# Patient Record
Sex: Male | Born: 1980 | Race: White | Hispanic: No | Marital: Married | Smoking: Current every day smoker
Health system: Southern US, Community
[De-identification: ages and names within clinical notes are randomized; demographics above are authoritative.]

## PROBLEM LIST (undated history)

## (undated) HISTORY — PX: MOUTH SURGERY: SHX715

---

## 2016-05-25 ENCOUNTER — Encounter (HOSPITAL_BASED_OUTPATIENT_CLINIC_OR_DEPARTMENT_OTHER): Payer: Self-pay | Admitting: *Deleted

## 2016-05-25 ENCOUNTER — Emergency Department (HOSPITAL_BASED_OUTPATIENT_CLINIC_OR_DEPARTMENT_OTHER)
Admission: EM | Admit: 2016-05-25 | Discharge: 2016-05-26 | Disposition: A | Discharge status: Home / Self Care | Payer: Self-pay | Attending: Emergency Medicine | Admitting: Emergency Medicine

## 2016-05-25 DIAGNOSIS — R112 Nausea with vomiting, unspecified: Secondary | ICD-10-CM | POA: Insufficient documentation

## 2016-05-25 DIAGNOSIS — R519 Headache, unspecified: Secondary | ICD-10-CM

## 2016-05-25 DIAGNOSIS — I10 Essential (primary) hypertension: Secondary | ICD-10-CM | POA: Insufficient documentation

## 2016-05-25 DIAGNOSIS — R300 Dysuria: Secondary | ICD-10-CM | POA: Insufficient documentation

## 2016-05-25 DIAGNOSIS — M549 Dorsalgia, unspecified: Secondary | ICD-10-CM | POA: Insufficient documentation

## 2016-05-25 DIAGNOSIS — R51 Headache: Secondary | ICD-10-CM | POA: Insufficient documentation

## 2016-05-25 DIAGNOSIS — F17200 Nicotine dependence, unspecified, uncomplicated: Secondary | ICD-10-CM | POA: Insufficient documentation

## 2016-05-25 LAB — URINALYSIS, ROUTINE W REFLEX MICROSCOPIC
Bilirubin Urine: NEGATIVE
GLUCOSE, UA: NEGATIVE mg/dL
Hgb urine dipstick: NEGATIVE
KETONES UR: NEGATIVE mg/dL
LEUKOCYTES UA: NEGATIVE
Nitrite: NEGATIVE
PH: 5.5 (ref 5.0–8.0)
Protein, ur: NEGATIVE mg/dL
SPECIFIC GRAVITY, URINE: 1.025 (ref 1.005–1.030)

## 2016-05-25 MED ORDER — KETOROLAC TROMETHAMINE 30 MG/ML IJ SOLN
15.0000 mg | Freq: Once | INTRAMUSCULAR | Status: AC
  Administered 2016-05-25: 15 mg via INTRAVENOUS
  Filled 2016-05-25: qty 1

## 2016-05-25 MED ORDER — METOCLOPRAMIDE HCL 5 MG/ML IJ SOLN
10.0000 mg | Freq: Once | INTRAMUSCULAR | Status: AC
  Administered 2016-05-25: 10 mg via INTRAVENOUS
  Filled 2016-05-25: qty 2

## 2016-05-25 MED ORDER — DIPHENHYDRAMINE HCL 50 MG/ML IJ SOLN
25.0000 mg | Freq: Once | INTRAMUSCULAR | Status: AC
  Administered 2016-05-25: 25 mg via INTRAVENOUS
  Filled 2016-05-25: qty 1

## 2016-05-25 NOTE — ED Provider Notes (Signed)
[facility] Provider Note   CSN: [id number] Arrival date & time: 05/25/16  2222  By signing my name below, I, [name], attest that this documentation has been prepared under the direction and in the presence of [name], MD . Electronically Signed: [name], ED Scribe. 05/25/2016. 11:11 PM.   History   Chief Complaint Chief Complaint  Patient presents with  . Headache   The history is provided by the patient. No language interpreter was used.  Headache   This is a recurrent problem. Episode onset: 2 weeks ago. Episode frequency: Every morning, persists throughout the day. The problem has not changed since onset.The headache is associated with an unknown factor. The pain is moderate. The pain does not radiate. Associated symptoms include nausea and vomiting. Pertinent negatives include no fever, no syncope and no shortness of breath. He has tried acetaminophen (Vicodin) for the symptoms. The treatment provided no relief.   HPI Comments:  Carlos Estrada is a 35 y.o. male with PMHx of HTN who presents to the Emergency Department complaining of intermittent, daily headaches for the past 2 weeks. Pt states that he typically has at least 1 headache monthly but has not had headaches for this many consecutive days before.Pt complains of associated nausea, vomiting, diaphoresis.  Pt also reports that he woke up this morning with lower back pain with associated dysuria. Pt has taken tylenol and Vicodin with no relief. Pt denies head injury, recent travel or possible animal/insect bites. Pt denies chest pain, SOB, neck pain, abdominal pain, weakness, visual changes, numbness, rash, penile discharge.    PMH - none Soc hx - no travel Home Medications    Prior to Admission medications   Not on File    Family History No family history on file.  Social History Social History  Substance Use Topics  . Smoking status: Current Every Day Smoker    Packs/day:  1.00  . Smokeless tobacco: Never Used  . Alcohol use No     Allergies   Review of patient's allergies indicates no known allergies.   Review of Systems Review of Systems  Constitutional: Negative for fever.  Respiratory: Negative for shortness of breath.   Cardiovascular: Negative for chest pain and syncope.  Gastrointestinal: Positive for nausea and vomiting. Negative for abdominal pain.  Genitourinary: Positive for difficulty urinating and dysuria. Negative for discharge.  Musculoskeletal: Positive for back pain. Negative for neck pain.  Skin: Negative for rash.  Neurological: Positive for headaches. Negative for weakness and numbness.  All other systems reviewed and are negative.    Physical Exam Updated Vital Signs BP 138/100 (BP Location: Left Arm)   Pulse 80   Temp 97.6 F (36.4 C) (Oral)   Resp 18   Ht 5\' 9"  (1.753 m)   Wt 200 lb (90.7 kg)   SpO2 99%   BMI 29.53 kg/m   Physical Exam CONSTITUTIONAL: Well developed/well nourished HEAD: Normocephalic/atraumatic EYES: EOMI/PERRL, no nystagmus, no ptosis ENMT: Mucous membranes moist NECK: supple no meningeal signs, no bruits SPINE/BACK:entire spine nontender CV: S1/S2 noted, no murmurs/rubs/gallops noted LUNGS: Lungs are clear to auscultation bilaterally, no apparent distress ABDOMEN: soft, nontender, no rebound or guarding GU:no cva tenderness NEURO:Awake/alert, face symmetric, no arm or leg drift is noted Equal 5/5 strength with shoulder abduction, elbow flex/extension, wrist flex/extension in upper extremities and equal hand grips bilaterally Equal 5/5 strength with hip flexion,knee flex/extension, foot dorsi/plantar flexion Cranial nerves 3/4/5/6/02/22/09/11/12 tested and intact Gait normal without ataxia No past  pointing Sensation to light touch intact in all extremities EXTREMITIES: pulses normal, full ROM SKIN: warm, color normal PSYCH: no abnormalities of mood noted, alert and oriented to  situation    ED Treatments / Results  DIAGNOSTIC STUDIES:  Oxygen Saturation is 99% on RA, normal by my interpretation.    COORDINATION OF CARE:  11:11 PM Discussed treatment plan with pt at bedside and pt agreed to plan.   Labs (all labs ordered are listed, but only abnormal results are displayed) Labs Reviewed  BASIC METABOLIC PANEL - Abnormal; Notable for the following:       Result Value   Glucose, Bld 110 (*)    All other components within normal limits  URINALYSIS, ROUTINE W REFLEX MICROSCOPIC (NOT [facility])  GC/CHLAMYDIA PROBE AMP ([place]) NOT AT [facility]    EKG  EKG Interpretation None       Radiology No results found.  Procedures Procedures (including critical care time)  Medications Ordered in ED Medications  metoCLOPramide (REGLAN) injection 10 mg (10 mg Intravenous Given 05/25/16 2346)  diphenhydrAMINE (BENADRYL) injection 25 mg (25 mg Intravenous Given 05/25/16 2346)  ketorolac (TORADOL) 30 MG/ML injection 15 mg (15 mg Intravenous Given 05/25/16 2345)     Initial Impression / Assessment and Plan / ED Course  I have reviewed the triage vital signs and the nursing notes.  Pertinent labs results that were available during my care of the patient were reviewed by me and considered in my medical decision making (see chart for details).  Clinical Course    Pt improved No distress noted No neuro deficits He reports monthly headaches, but this worse than normal He reports he is frequently found to have HTN but no meds started He has no PCP at this time Will start HCTZ low dose and given referral to PCP At this point there are no clinical signs of acute neurologic emergency  BP 130/82   Pulse 72   Temp 97.6 F (36.4 C) (Oral)   Resp 18   Ht 5\' 9"  (1.753 m)   Wt 90.7 kg   SpO2 97%   BMI 29.53 kg/m   Final Clinical Impressions(s) / ED Diagnoses   Final diagnoses:  Acute nonintractable headache, unspecified headache type  Hypertension,  unspecified type    New Prescriptions New Prescriptions   HYDROCHLOROTHIAZIDE (HYDRODIURIL) 12.5 MG TABLET    Take 1 tablet (12.5 mg total) by mouth daily.  I personally performed the services described in this documentation, which was scribed in my presence. The recorded information has been reviewed and is accurate.        Zadie Rhineonald Darshan Solanki, MD 05/26/16 [phone]

## 2016-05-25 NOTE — ED Triage Notes (Addendum)
Headache x 2 weeks. Nausea, light sensitive. States he has burning with urination.

## 2016-05-25 NOTE — ED Notes (Signed)
Head pain off and on for the last two weeks that is unrelieved by tylenol and vicodin with photosensitivity and nausea.

## 2016-05-26 LAB — BASIC METABOLIC PANEL
Anion gap: 6 (ref 5–15)
BUN: 20 mg/dL (ref 6–20)
CHLORIDE: 106 mmol/L (ref 101–111)
CO2: 26 mmol/L (ref 22–32)
Calcium: 9.1 mg/dL (ref 8.9–10.3)
Creatinine, Ser: 1.02 mg/dL (ref 0.61–1.24)
GFR calc non Af Amer: >60 mL/min (ref >60)
Glucose, Bld: 110 mg/dL — ABNORMAL HIGH (ref 65–99)
Potassium: 4.1 mmol/L (ref 3.5–5.1)
SODIUM: 138 mmol/L (ref 135–145)

## 2016-05-26 MED ORDER — HYDROCHLOROTHIAZIDE 12.5 MG PO TABS: 12.5000 mg | ORAL_TABLET | Freq: Every day | ORAL | 0 refills | Status: DC

## 2016-05-26 NOTE — ED Notes (Signed)
Pt verbalizes understanding of d/c instructions and denies any further needs at this time. 

## 2016-05-26 NOTE — Discharge Instructions (Signed)

## 2016-05-27 LAB — GC/CHLAMYDIA PROBE AMP (~~LOC~~) NOT AT ARMC
Chlamydia: NEGATIVE
Neisseria Gonorrhea: NEGATIVE

## 2016-11-18 ENCOUNTER — Emergency Department (HOSPITAL_BASED_OUTPATIENT_CLINIC_OR_DEPARTMENT_OTHER)
Admission: EM | Admit: 2016-11-18 | Discharge: 2016-11-18 | Disposition: A | Discharge status: Home / Self Care | Payer: Self-pay | Attending: Emergency Medicine | Admitting: Emergency Medicine

## 2016-11-18 ENCOUNTER — Encounter (HOSPITAL_BASED_OUTPATIENT_CLINIC_OR_DEPARTMENT_OTHER): Payer: Self-pay | Admitting: *Deleted

## 2016-11-18 ENCOUNTER — Emergency Department (HOSPITAL_BASED_OUTPATIENT_CLINIC_OR_DEPARTMENT_OTHER): Payer: Self-pay

## 2016-11-18 DIAGNOSIS — F17200 Nicotine dependence, unspecified, uncomplicated: Secondary | ICD-10-CM | POA: Insufficient documentation

## 2016-11-18 DIAGNOSIS — M5442 Lumbago with sciatica, left side: Secondary | ICD-10-CM | POA: Insufficient documentation

## 2016-11-18 DIAGNOSIS — M5432 Sciatica, left side: Secondary | ICD-10-CM

## 2016-11-18 MED ORDER — HYDROCODONE-ACETAMINOPHEN 5-325 MG PO TABS
1.0000 | ORAL_TABLET | Freq: Once | ORAL | Status: AC
  Administered 2016-11-18: 1 via ORAL
  Filled 2016-11-18: qty 1

## 2016-11-18 MED ORDER — KETOROLAC TROMETHAMINE 60 MG/2ML IM SOLN
60.0000 mg | Freq: Once | INTRAMUSCULAR | Status: AC
  Administered 2016-11-18: 60 mg via INTRAMUSCULAR
  Filled 2016-11-18: qty 2

## 2016-11-18 MED ORDER — HYDROCODONE-ACETAMINOPHEN 5-325 MG PO TABS: 1.0000 | ORAL_TABLET | Freq: Four times a day (QID) | ORAL | 0 refills | Status: DC | PRN

## 2016-11-18 MED ORDER — CYCLOBENZAPRINE HCL 10 MG PO TABS: 10.0000 mg | ORAL_TABLET | Freq: Every day | ORAL | 0 refills | Status: DC

## 2016-11-18 MED ORDER — PREDNISONE 50 MG PO TABS: 50.0000 mg | ORAL_TABLET | Freq: Every day | ORAL | 0 refills | Status: DC

## 2016-11-18 MED FILL — CYCLOBENZAPRINE 10 MG TAB: 10 | 10 days supply | Qty: 10 | Fill #0

## 2016-11-18 MED FILL — HYDROCODON-APAP 5-325: 5-325 | 4 days supply | Qty: 15 | Fill #0

## 2016-11-18 MED FILL — predniSONE 50 MG TABS: 50 | 5 days supply | Qty: 5 | Fill #0

## 2016-11-18 NOTE — ED Notes (Signed)
Pt given Rx x 3 for flexeril, hydrocodone, and prednisone. Pt has a ride at bedside

## 2016-11-18 NOTE — ED Provider Notes (Signed)
[facility]   CSN: [id number] Arrival date & time: 11/18/16  1612  By signing my name below, I, Arianna Nassar, attest that this documentation has been prepared under the direction and in the presence of [employer], PA-C.  Electronically Signed: Octavia Heir, ED Scribe. 11/18/16. 4:40 PM.    History   Chief Complaint Chief Complaint  Patient presents with  . Back Pain   The history is provided by the patient. No language interpreter was used.   HPI Comments: Carlos Estrada is a 36 y.o. male who presents to the Emergency Department complaining of acute onset, recurrent, low back pain that radiates into his left hip x several months. He notes associated pain when coughing. Pt reports difficulty sitting up and ambulating secondary to pain. He has been evaluated for this in the past but has no intervention. He denies any heavy lifting or injury to his back. Pt does not have any bowel or bladder incontinence and weakness of his lower extremities.   History reviewed. No pertinent past medical history.  There are no active problems to display for this patient.   History reviewed. No pertinent surgical history.     Home Medications    Prior to Admission medications   Not on File    Family History History reviewed. No pertinent family history.  Social History Social History  Substance Use Topics  . Smoking status: Current Every Day Smoker    Packs/day: 1.00  . Smokeless tobacco: Never Used  . Alcohol use No     Allergies   Zithromax [azithromycin]   Review of Systems Review of Systems  A complete 10 system review of systems was obtained and all systems are negative except as noted in the HPI and PMH.   Physical Exam Updated Vital Signs BP (!) 137/93 (BP Location: Left Arm)   Pulse 100   Temp 98.1 F (36.7 C) (Oral)   Resp 18   Ht  (1.753 m)   Wt 210 lb (95.3 kg)   SpO2 99%   BMI 31.01 kg/m   Physical Exam    Constitutional: He is oriented to person, place, and time. He appears well-developed and well-nourished.  HENT:  Head: Normocephalic.  Eyes: EOM are normal.  Neck: Normal range of motion.  Pulmonary/Chest: Effort normal.  Abdominal: He exhibits no distension.  Musculoskeletal: Normal range of motion. He exhibits no tenderness.  Subjective spasm in left lower lateral back  Neurological: He is alert and oriented to person, place, and time.  Psychiatric: He has a normal mood and affect.  Nursing note and vitals reviewed.    ED Treatments / Results  DIAGNOSTIC STUDIES: Oxygen Saturation is 99% on RA, normal by my interpretation.  COORDINATION OF CARE:  4:39 PM Discussed treatment plan with pt at bedside and pt agreed to plan.  Labs (all labs ordered are listed, but only abnormal results are displayed) Labs Reviewed - No data to display  EKG  EKG Interpretation None       Radiology No results found.  Procedures Procedures (including critical care time)  Medications Ordered in ED Medications - No data to display   Initial Impression / Assessment and Plan / ED Course  I have reviewed the triage vital signs and the nursing notes.  Pertinent labs & imaging results that were available during my care of the patient were reviewed by me and considered in my medical decision making (see chart for details).     The patient  will be referred to neurosurgery for further eval. Told to return here as needed. Ice and heat to his back. This is sciatica that could have a component associated with a disc issue.  Final Clinical Impressions(s) / ED Diagnoses   Final diagnoses:  None  I personally performed the services described in this documentation, which was scribed in my presence. The recorded information has been reviewed and is accurate.  New Prescriptions New Prescriptions   No medications on file     Charlestine Night, PA-C 11/18/16 1718    Loren Racer,  MD 11/18/16 2325

## 2016-11-18 NOTE — ED Notes (Addendum)
Pain radiates into left buttocks with certain movements.

## 2016-11-18 NOTE — Discharge Instructions (Signed)
Return here as needed. Follow up with the doctor provided. Your x-rays were negative. You will need further evaluation however.

## 2016-11-18 NOTE — ED Triage Notes (Signed)
Pt reports back and left hip pain x several months.

## 2017-03-04 ENCOUNTER — Encounter (HOSPITAL_BASED_OUTPATIENT_CLINIC_OR_DEPARTMENT_OTHER): Payer: Self-pay

## 2017-03-04 ENCOUNTER — Emergency Department (HOSPITAL_BASED_OUTPATIENT_CLINIC_OR_DEPARTMENT_OTHER)
Admission: EM | Admit: 2017-03-04 | Discharge: 2017-03-05 | Disposition: A | Discharge status: Home / Self Care | Payer: Self-pay | Attending: Emergency Medicine | Admitting: Emergency Medicine

## 2017-03-04 DIAGNOSIS — F17200 Nicotine dependence, unspecified, uncomplicated: Secondary | ICD-10-CM | POA: Insufficient documentation

## 2017-03-04 DIAGNOSIS — R740 Nonspecific elevation of levels of transaminase and lactic acid dehydrogenase [LDH]: Secondary | ICD-10-CM | POA: Insufficient documentation

## 2017-03-04 DIAGNOSIS — R1013 Epigastric pain: Secondary | ICD-10-CM | POA: Insufficient documentation

## 2017-03-04 DIAGNOSIS — R7401 Elevation of levels of liver transaminase levels: Secondary | ICD-10-CM

## 2017-03-04 NOTE — ED Triage Notes (Signed)
Pt c/o midsternal chest pain for the last two days that comes and goes without any associated symptoms and he currently denies pain.  Pt had an episode last night while trying to have a bowel movement with the same pain, but he also had SOB, diaphoresis and dizziness.  No acute distress at this time.

## 2017-03-05 LAB — CBC WITH DIFFERENTIAL/PLATELET
BASOS ABS: 0 10*3/uL (ref 0.0–0.1)
BASOS PCT: 0 %
EOS ABS: 0.2 10*3/uL (ref 0.0–0.7)
Eosinophils Relative: 2 %
HCT: 42.7 % (ref 39.0–52.0)
Hemoglobin: 14.4 g/dL (ref 13.0–17.0)
Lymphocytes Relative: 32 %
Lymphs Abs: 3 10*3/uL (ref 0.7–4.0)
MCH: 28.6 pg (ref 26.0–34.0)
MCHC: 33.7 g/dL (ref 30.0–36.0)
MCV: 84.9 fL (ref 78.0–100.0)
MONO ABS: 0.9 10*3/uL (ref 0.1–1.0)
MONOS PCT: 10 %
Neutro Abs: 5.1 10*3/uL (ref 1.7–7.7)
Neutrophils Relative %: 56 %
PLATELETS: 295 10*3/uL (ref 150–400)
RBC: 5.03 MIL/uL (ref 4.22–5.81)
RDW: 12.8 % (ref 11.5–15.5)
WBC: 9.1 10*3/uL (ref 4.0–10.5)

## 2017-03-05 LAB — COMPREHENSIVE METABOLIC PANEL
ALBUMIN: 3.7 g/dL (ref 3.5–5.0)
ALT: 165 U/L — AB (ref 17–63)
AST: 156 U/L — ABNORMAL HIGH (ref 15–41)
Alkaline Phosphatase: 117 U/L (ref 38–126)
Anion gap: 8 (ref 5–15)
BILIRUBIN TOTAL: 0.2 mg/dL — AB (ref 0.3–1.2)
BUN: 18 mg/dL (ref 6–20)
CO2: 28 mmol/L (ref 22–32)
CREATININE: 1.06 mg/dL (ref 0.61–1.24)
Calcium: 9 mg/dL (ref 8.9–10.3)
Chloride: 102 mmol/L (ref 101–111)
GFR calc non Af Amer: >60 mL/min (ref >60)
GLUCOSE: 99 mg/dL (ref 65–99)
Potassium: 4.3 mmol/L (ref 3.5–5.1)
SODIUM: 138 mmol/L (ref 135–145)
TOTAL PROTEIN: 6.8 g/dL (ref 6.5–8.1)

## 2017-03-05 LAB — LIPASE, BLOOD: LIPASE: 29 U/L (ref 11–51)

## 2017-03-05 LAB — TROPONIN I

## 2017-03-05 MED ORDER — OMEPRAZOLE 20 MG PO CPDR: 20.0000 mg | DELAYED_RELEASE_CAPSULE | Freq: Every day | ORAL | 0 refills | Status: DC

## 2017-03-05 MED ORDER — PANTOPRAZOLE SODIUM 40 MG IV SOLR
40.0000 mg | Freq: Once | INTRAVENOUS | Status: AC
  Administered 2017-03-05: 40 mg via INTRAVENOUS
  Filled 2017-03-05: qty 40

## 2017-03-05 NOTE — ED Provider Notes (Signed)
[facility] Provider Note: Carlos Dell, MD, FACEP  CSN: [id number] MRN: [id number] ARRIVAL: 03/04/17 at 2320 ROOM: [facility]01/[facility]01   CHIEF COMPLAINT  Epigastric Pain   HISTORY OF PRESENT ILLNESS  Carlos Estrada is a 36 y.o. male with a two-day history of intermittent epigastric pain. 2 days ago the pain began as burning pain which she describes as a 4 out of 10. At that time there were no significant associated symptoms. About 24 hours ago he had a much worse episode which she describes as a sharp pain which was a 10 out of 10. As he was trying to have a bowel movement he became nauseated, diaphoretic and felt like he was going to pass out. He did not pass out and the feeling that he would pass out resolved after a few minutes. The pain lasted about 30 minutes total. He did not take anything for the pain except water which did not make a significant difference. There was no associated shortness of breath. There was no associated diarrhea. He has had several subsequent episodes of the milder, burning pain without other symptoms. Pain is not worse with movement or palpation of his epigastrium.   History reviewed. No pertinent past medical history.  Past Surgical History:  Procedure Laterality Date  . MOUTH SURGERY      No family history on file.  Social History  Substance Use Topics  . Smoking status: Current Every Day Smoker    Packs/day: 1.00  . Smokeless tobacco: Never Used  . Alcohol use No    Prior to Admission medications   Not on File    Allergies Zithromax [azithromycin]   REVIEW OF SYSTEMS  Negative except as noted here or in the History of Present Illness.   PHYSICAL EXAMINATION  Initial Vital Signs Blood pressure (!) 156/101, pulse 65, temperature 98 F (36.7 C), temperature source Oral, resp. rate 18, height 5\' 9"  (1.753 m), weight 93 kg (205 lb), SpO2 100 %.  Examination General: Well-developed, well-nourished male in no acute distress;  appearance consistent with age of record HENT: normocephalic; atraumatic Eyes: pupils equal, round and reactive to light; extraocular muscles intact Neck: supple Heart: regular rate and rhythm Lungs: clear to auscultation bilaterally Abdomen: soft; nondistended; nontender; no masses or hepatosplenomegaly; bowel sounds present Extremities: No deformity; full range of motion; pulses normal Neurologic: Awake, alert and oriented; motor function intact in all extremities and symmetric; no facial droop Skin: Warm and dry Psychiatric: Normal mood and affect   RESULTS  Summary of this visit's results, reviewed by myself:   EKG Interpretation  Date/Time:  Thursday March 04 2017 23:27:20 EDT Ventricular Rate:  65 PR Interval:    QRS Duration: 76 QT Interval:  369 QTC Calculation: 384 R Axis:   75 Text Interpretation:  Sinus rhythm Borderline T wave abnormalities No previous ECGs available Confirmed by Carlos Estrada ([id number]) on 03/04/2017 11:33:08 PM      Laboratory Studies: Results for orders placed or performed during the hospital encounter of 03/04/17 (from the past 24 hour(s))  Comprehensive metabolic panel     Status: Abnormal   Collection Time: 03/05/17 12:20 AM  Result Value Ref Range   Sodium 138 135 - 145 mmol/L   Potassium 4.3 3.5 - 5.1 mmol/L   Chloride 102 101 - 111 mmol/L   CO2 28 22 - 32 mmol/L   Glucose, Bld 99 65 - 99 mg/dL   BUN 18 6 - 20 mg/dL   Creatinine, Ser 3.29 0.61 -  1.24 mg/dL   Calcium 9.0 8.9 - 16.110.3 mg/dL   Total Protein 6.8 6.5 - 8.1 g/dL   Albumin 3.7 3.5 - 5.0 g/dL   AST 096156 (H) 15 - 41 U/L   ALT 165 (H) 17 - 63 U/L   Alkaline Phosphatase 117 38 - 126 U/L   Total Bilirubin 0.2 (L) 0.3 - 1.2 mg/dL   GFR calc non Af Amer >60 >60 mL/min   GFR calc Af Amer >60 >60 mL/min   Anion gap 8 5 - 15  CBC with Differential/Platelet     Status: None   Collection Time: 03/05/17 12:20 AM  Result Value Ref Range   WBC 9.1 4.0 - 10.5 K/uL   RBC 5.03 4.22 - 5.81  MIL/uL   Hemoglobin 14.4 13.0 - 17.0 g/dL   HCT 04.542.7 40.939.0 - 81.152.0 %   MCV 84.9 78.0 - 100.0 fL   MCH 28.6 26.0 - 34.0 pg   MCHC 33.7 30.0 - 36.0 g/dL   RDW 91.412.8 78.211.5 - 95.615.5 %   Platelets 295 150 - 400 K/uL   Neutrophils Relative % 56 %   Neutro Abs 5.1 1.7 - 7.7 K/uL   Lymphocytes Relative 32 %   Lymphs Abs 3.0 0.7 - 4.0 K/uL   Monocytes Relative 10 %   Monocytes Absolute 0.9 0.1 - 1.0 K/uL   Eosinophils Relative 2 %   Eosinophils Absolute 0.2 0.0 - 0.7 K/uL   Basophils Relative 0 %   Basophils Absolute 0.0 0.0 - 0.1 K/uL  Lipase, blood     Status: None   Collection Time: 03/05/17 12:20 AM  Result Value Ref Range   Lipase 29 11 - 51 U/L  Troponin I     Status: None   Collection Time: 03/05/17 12:20 AM  Result Value Ref Range   Troponin I <0.03 <0.03 ng/mL   Imaging Studies: No results found.  ED COURSE  Nursing notes and initial vitals signs, including pulse oximetry, reviewed.  Vitals:   03/04/17 2328 03/04/17 2332 03/05/17 0000 03/05/17 0100  BP:  (!) 156/101 140/88 (!) 137/91  Pulse:  65 65 65  Resp:  18 13 14   Temp:  98 F (36.7 C)    TempSrc:  Oral    SpO2:  100% 99% 97%  Weight: 93 kg (205 lb)     Height: 5\' 9"  (1.753 m)      1:12 AM Patient advised of mildly elevated transaminases but without elevated bilirubin or alkaline phosphatase. He denies Tylenol use, alcohol use or IV drug use. We will start him on a PPI for his epigastric pain and refer to gastroenterology for evaluation of his pain and his elevated transaminases.  PROCEDURES    ED DIAGNOSES     ICD-10-CM   1. Epigastric pain R10.13   2. Elevated transaminase level R74.0        Carlos Estrada, Carlos RuizJohn, MD 03/05/17 [id number]

## 2017-03-05 NOTE — ED Notes (Signed)
Pt verbalizes understanding of d/c instructions and denies any further needs at this time. 

## 2017-04-18 ENCOUNTER — Emergency Department (HOSPITAL_BASED_OUTPATIENT_CLINIC_OR_DEPARTMENT_OTHER)
Admission: EM | Admit: 2017-04-18 | Discharge: 2017-04-18 | Disposition: A | Discharge status: Home / Self Care | Payer: Self-pay | Attending: Emergency Medicine | Admitting: Emergency Medicine

## 2017-04-18 ENCOUNTER — Emergency Department (HOSPITAL_BASED_OUTPATIENT_CLINIC_OR_DEPARTMENT_OTHER): Payer: Self-pay

## 2017-04-18 ENCOUNTER — Encounter (HOSPITAL_BASED_OUTPATIENT_CLINIC_OR_DEPARTMENT_OTHER): Payer: Self-pay | Admitting: Emergency Medicine

## 2017-04-18 DIAGNOSIS — Z79899 Other long term (current) drug therapy: Secondary | ICD-10-CM | POA: Insufficient documentation

## 2017-04-18 DIAGNOSIS — R1013 Epigastric pain: Secondary | ICD-10-CM

## 2017-04-18 DIAGNOSIS — Y929 Unspecified place or not applicable: Secondary | ICD-10-CM | POA: Insufficient documentation

## 2017-04-18 DIAGNOSIS — Y999 Unspecified external cause status: Secondary | ICD-10-CM | POA: Insufficient documentation

## 2017-04-18 DIAGNOSIS — S20211A Contusion of right front wall of thorax, initial encounter: Secondary | ICD-10-CM | POA: Insufficient documentation

## 2017-04-18 DIAGNOSIS — Y939 Activity, unspecified: Secondary | ICD-10-CM | POA: Insufficient documentation

## 2017-04-18 DIAGNOSIS — W19XXXA Unspecified fall, initial encounter: Secondary | ICD-10-CM | POA: Insufficient documentation

## 2017-04-18 DIAGNOSIS — F17210 Nicotine dependence, cigarettes, uncomplicated: Secondary | ICD-10-CM | POA: Insufficient documentation

## 2017-04-18 LAB — COMPREHENSIVE METABOLIC PANEL
ALBUMIN: 3.9 g/dL (ref 3.5–5.0)
ALK PHOS: 79 U/L (ref 38–126)
ALT: 64 U/L — ABNORMAL HIGH (ref 17–63)
AST: 141 U/L — ABNORMAL HIGH (ref 15–41)
Anion gap: 8 (ref 5–15)
BUN: 17 mg/dL (ref 6–20)
CHLORIDE: 105 mmol/L (ref 101–111)
CO2: 24 mmol/L (ref 22–32)
CREATININE: 0.96 mg/dL (ref 0.61–1.24)
Calcium: 8.8 mg/dL — ABNORMAL LOW (ref 8.9–10.3)
GFR calc non Af Amer: >60 mL/min (ref >60)
Glucose, Bld: 132 mg/dL — ABNORMAL HIGH (ref 65–99)
Potassium: 3.5 mmol/L (ref 3.5–5.1)
SODIUM: 137 mmol/L (ref 135–145)
Total Bilirubin: 0.4 mg/dL (ref 0.3–1.2)
Total Protein: 6.7 g/dL (ref 6.5–8.1)

## 2017-04-18 LAB — CBC WITH DIFFERENTIAL/PLATELET
Basophils Absolute: 0 10*3/uL (ref 0.0–0.1)
Basophils Relative: 0 %
EOS ABS: 0.2 10*3/uL (ref 0.0–0.7)
Eosinophils Relative: 1 %
HEMATOCRIT: 43.1 % (ref 39.0–52.0)
HEMOGLOBIN: 14.4 g/dL (ref 13.0–17.0)
LYMPHS ABS: 2 10*3/uL (ref 0.7–4.0)
Lymphocytes Relative: 12 %
MCH: 28.4 pg (ref 26.0–34.0)
MCHC: 33.4 g/dL (ref 30.0–36.0)
MCV: 85 fL (ref 78.0–100.0)
MONOS PCT: 6 %
Monocytes Absolute: 1 10*3/uL (ref 0.1–1.0)
NEUTROS PCT: 81 %
Neutro Abs: 13 10*3/uL — ABNORMAL HIGH (ref 1.7–7.7)
Platelets: 246 10*3/uL (ref 150–400)
RBC: 5.07 MIL/uL (ref 4.22–5.81)
RDW: 12.4 % (ref 11.5–15.5)
WBC: 16.2 10*3/uL — AB (ref 4.0–10.5)

## 2017-04-18 LAB — LIPASE, BLOOD: LIPASE: 29 U/L (ref 11–51)

## 2017-04-18 MED ORDER — SUCRALFATE 1 GM/10ML PO SUSP
1.0000 g | Freq: Three times a day (TID) | ORAL | Status: DC
  Administered 2017-04-18: 1 g via ORAL
  Filled 2017-04-18: qty 10

## 2017-04-18 MED ORDER — OMEPRAZOLE 20 MG PO CPDR: 20.0000 mg | DELAYED_RELEASE_CAPSULE | Freq: Every day | ORAL | 0 refills | Status: AC

## 2017-04-18 MED ORDER — PANTOPRAZOLE SODIUM 40 MG IV SOLR
40.0000 mg | Freq: Once | INTRAVENOUS | Status: AC
  Administered 2017-04-18: 40 mg via INTRAVENOUS
  Filled 2017-04-18: qty 40

## 2017-04-18 NOTE — ED Provider Notes (Signed)
[facility] Provider Note: Carlos Dell, MD, FACEP  CSN: [id number] MRN: [id number] ARRIVAL: 04/18/17 at 0017 ROOM: [facility]10/[facility]10   CHIEF COMPLAINT  Abdominal Pain (epigastric)   HISTORY OF PRESENT ILLNESS  04/18/17 12:38 AM Carlos Estrada is a 36 y.o. male who was seen by myself in July for epigastric pain. At the time he had mildly elevated transaminases. He was given a prescription for omeprazole which do not fill and he did not follow-up. He is here with a new episode of epigastric pain that began about 2 hours ago while eating. The onset was sudden. He describes the pain as severe and feeling like knives in his stomach. He became nauseated and clammy but he did not vomit. He had a bowel movement but significantly improved his pain. He is still having pain but it is much milder. Pain is somewhat worse with movement or palpation.  He states he had a wood floor break away from him yesterday morning about 11 AM and he struck the right side of his chest. He is having moderate pain in his right chest wall, worse with movement or palpation. He is not having shortness of breath.   History reviewed. No pertinent past medical history.  Past Surgical History:  Procedure Laterality Date  . MOUTH SURGERY      History reviewed. No pertinent family history.  Social History  Substance Use Topics  . Smoking status: Current Every Day Smoker    Packs/day: 2.00    Types: Cigarettes  . Smokeless tobacco: Never Used  . Alcohol use No     Comment: occ    Prior to Admission medications   Medication Sig Start Date End Date Taking? Authorizing Provider  omeprazole (PRILOSEC) 20 MG capsule Take 1 capsule (20 mg total) by mouth daily. 03/05/17   Haik Mahoney, MD    Allergies Zithromax [azithromycin]   REVIEW OF SYSTEMS  Negative except as noted here or in the History of Present Illness.   PHYSICAL EXAMINATION  Initial Vital Signs Blood pressure (!) 163/103, pulse 77, temperature  98.2 F (36.8 C), temperature source Oral, resp. rate 16, height 5\' 9"  (1.753 m), weight 97.5 kg (215 lb), SpO2 99 %.  Examination General: Well-developed, well-nourished male in no acute distress; appearance consistent with age of record HENT: normocephalic; atraumatic Eyes: pupils equal, round and reactive to light; extraocular muscles intact Neck: supple Heart: regular rate and rhythm Lungs: clear to auscultation bilaterally Chest: Right lateral rib tenderness without deformity or crepitus Abdomen: soft; nondistended; mild epigastric tenderness; no masses or hepatosplenomegaly; bowel sounds present Extremities: No deformity; full range of motion Neurologic: Awake, alert and oriented; motor function intact in all extremities and symmetric; no facial droop Skin: Warm and dry Psychiatric: Normal mood and affect   RESULTS  Summary of this visit's results, reviewed by myself:   EKG Interpretation  Date/Time:    Ventricular Rate:    PR Interval:    QRS Duration:   QT Interval:    QTC Calculation:   R Axis:     Text Interpretation:        Laboratory Studies: Results for orders placed or performed during the hospital encounter of 04/18/17 (from the past 24 hour(s))  CBC with Differential/Platelet     Status: Abnormal   Collection Time: 04/18/17 12:52 AM  Result Value Ref Range   WBC 16.2 (H) 4.0 - 10.5 K/uL   RBC 5.07 4.22 - 5.81 MIL/uL   Hemoglobin 14.4 13.0 - 17.0 g/dL  HCT 43.1 39.0 - 52.0 %   MCV 85.0 78.0 - 100.0 fL   MCH 28.4 26.0 - 34.0 pg   MCHC 33.4 30.0 - 36.0 g/dL   RDW 16.112.4 09.611.5 - 04.515.5 %   Platelets 246 150 - 400 K/uL   Neutrophils Relative % 81 %   Neutro Abs 13.0 (H) 1.7 - 7.7 K/uL   Lymphocytes Relative 12 %   Lymphs Abs 2.0 0.7 - 4.0 K/uL   Monocytes Relative 6 %   Monocytes Absolute 1.0 0.1 - 1.0 K/uL   Eosinophils Relative 1 %   Eosinophils Absolute 0.2 0.0 - 0.7 K/uL   Basophils Relative 0 %   Basophils Absolute 0.0 0.0 - 0.1 K/uL  Lipase,  blood     Status: None   Collection Time: 04/18/17 12:52 AM  Result Value Ref Range   Lipase 29 11 - 51 U/L  Comprehensive metabolic panel     Status: Abnormal   Collection Time: 04/18/17 12:52 AM  Result Value Ref Range   Sodium 137 135 - 145 mmol/L   Potassium 3.5 3.5 - 5.1 mmol/L   Chloride 105 101 - 111 mmol/L   CO2 24 22 - 32 mmol/L   Glucose, Bld 132 (H) 65 - 99 mg/dL   BUN 17 6 - 20 mg/dL   Creatinine, Ser 4.090.96 0.61 - 1.24 mg/dL   Calcium 8.8 (L) 8.9 - 10.3 mg/dL   Total Protein 6.7 6.5 - 8.1 g/dL   Albumin 3.9 3.5 - 5.0 g/dL   AST 811141 (H) 15 - 41 U/L   ALT 64 (H) 17 - 63 U/L   Alkaline Phosphatase 79 38 - 126 U/L   Total Bilirubin 0.4 0.3 - 1.2 mg/dL   GFR calc non Af Amer >60 >60 mL/min   GFR calc Af Amer >60 >60 mL/min   Anion gap 8 5 - 15   Imaging Studies: Dg Ribs Unilateral W/chest Right  Result Date: 04/18/2017 CLINICAL DATA:  Right rib pain today after falling while pressure washing EXAM: RIGHT RIBS AND CHEST - 3+ VIEW COMPARISON:  None. FINDINGS: Right rib detail images are negative for displaced fracture or significant bone abnormality. The lungs are clear. Hilar and mediastinal contours are normal. Heart size is normal. No pleural effusion. Normal pulmonary vasculature. IMPRESSION: No significant abnormality Electronically Signed   By: Ellery Plunkaniel R Mitchell M.D.   On: 04/18/2017 01:20    ED COURSE  Nursing notes and initial vitals signs, including pulse oximetry, reviewed.  Vitals:   04/18/17 0022 04/18/17 0023  BP: (!) 163/103   Pulse: 77   Resp: 16   Temp: 98.2 F (36.8 C)   TempSrc: Oral   SpO2: 99%   Weight:  97.5 kg (215 lb)  Height:  5\' 9"  (1.753 m)   1:36 AM Patient's transaminases improved from previous visit. Rib films are negative for acute fracture. We will place him on a PPI.  PROCEDURES    ED DIAGNOSES     ICD-10-CM   1. Epigastric pain R10.13   2. Rib contusion, right, initial encounter B14.782NS20.211A        Paula LibraMolpus, Chasity Outten, MD 04/18/17  [phone]

## 2017-04-18 NOTE — ED Triage Notes (Signed)
Patient with epigastric pain, started tonight, denies N/V. Was evaluated by EMS at home and declined transport. Seen for same July 2018, given Rx for Prilosec, did not fill, did not follow up.

## 2019-02-19 IMAGING — CR DG RIBS W/ CHEST 3+V*R*
3 series · 3 of 3 positions shown · non-contrast
Comparison: None.

CLINICAL DATA: Right rib pain today after falling while pressure
washing

EXAM:
RIGHT RIBS AND CHEST - 3+ VIEW

[w chest pa]
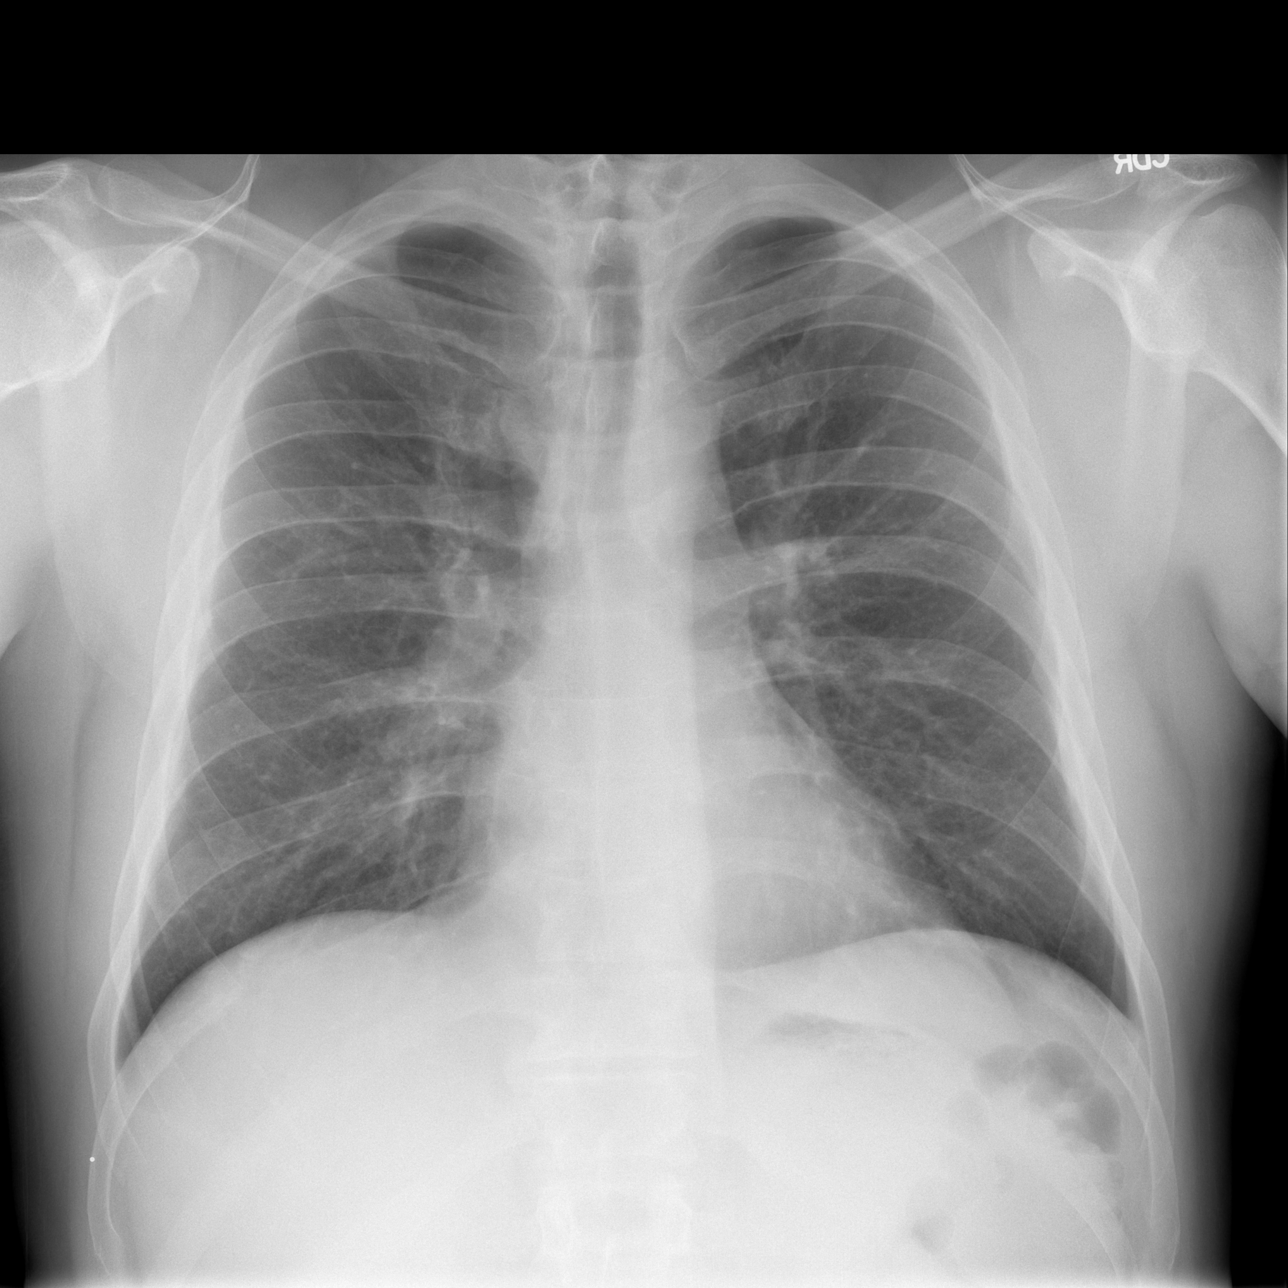

[w ribs ap/pa upper right]
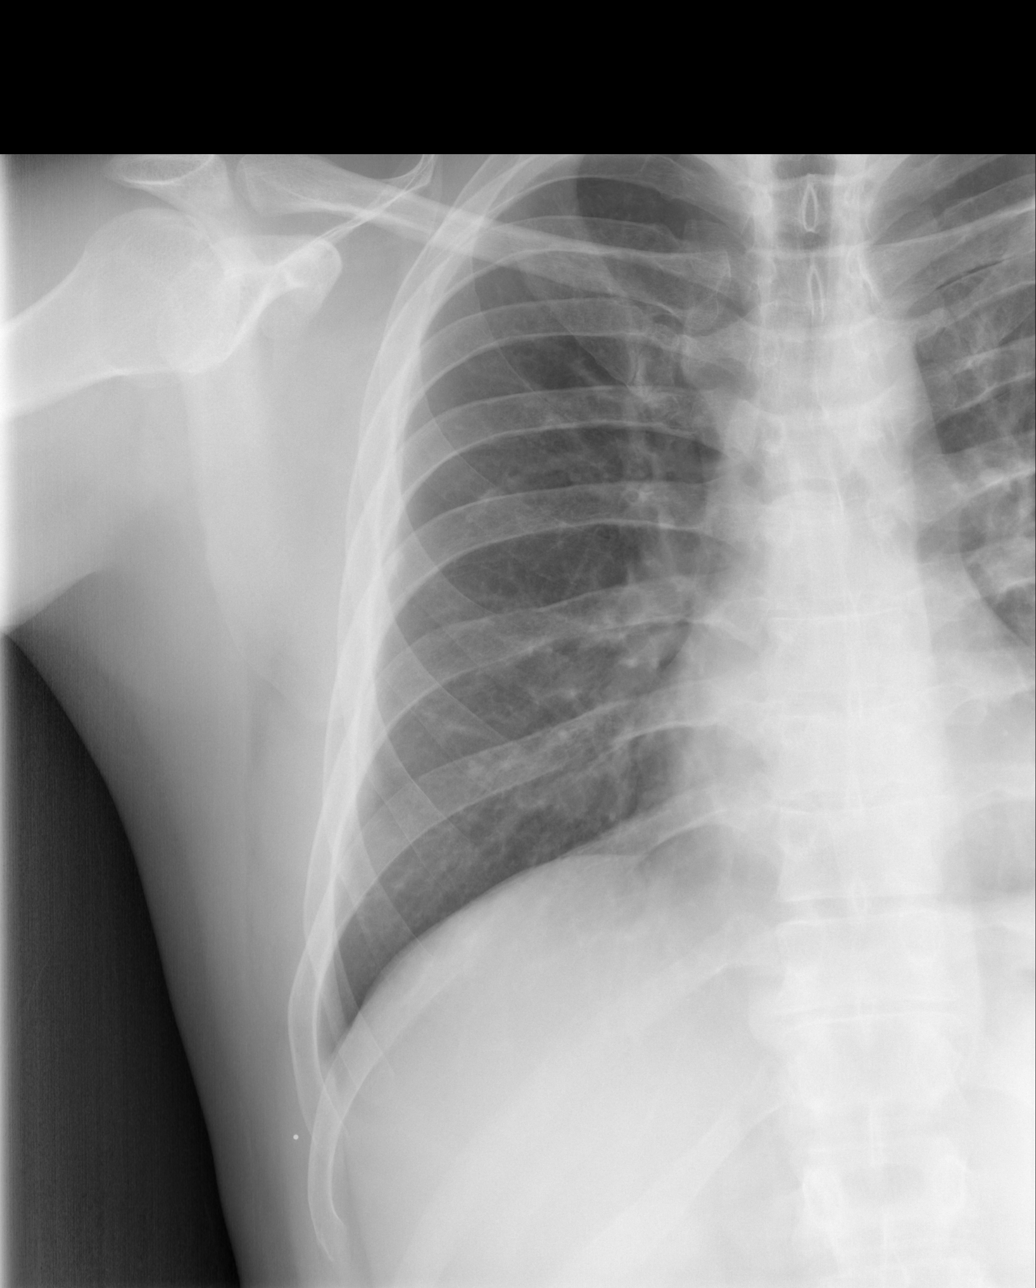

[w ribs oblique right]
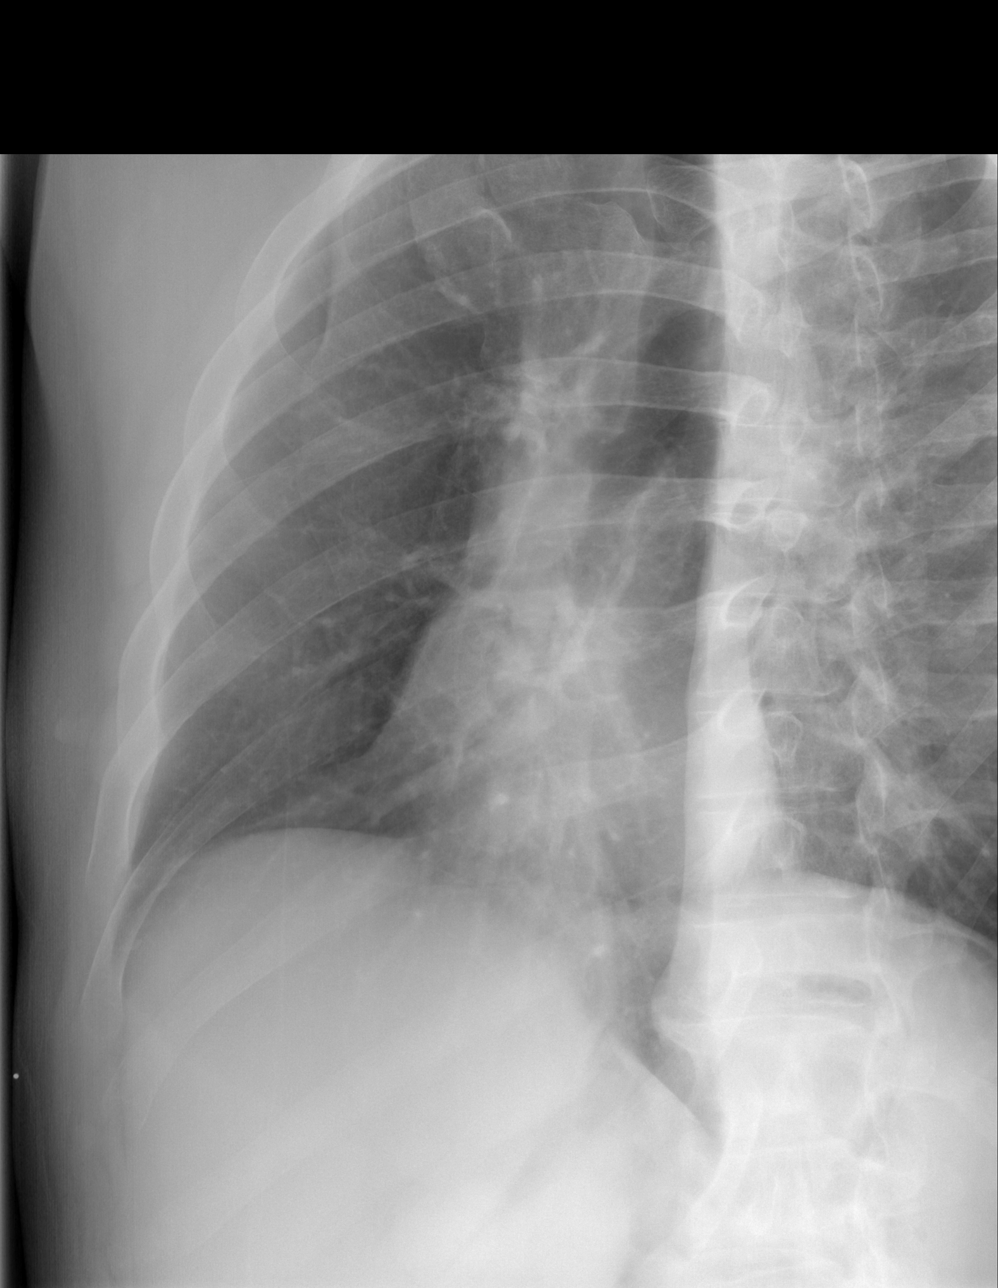

[3 of 3 positions shown; findings below may reference images not displayed]

FINDINGS: Right rib detail images are negative for displaced fracture or
significant bone abnormality. The lungs are clear. Hilar and
mediastinal contours are normal. Heart size is normal. No pleural
effusion. Normal pulmonary vasculature.
IMPRESSION: No significant abnormality
